# Patient Record
Sex: Female | Born: 1960 | Race: Black or African American | Hispanic: No | Marital: Single | Smoking: Never smoker
Health system: Southern US, Community
[De-identification: ages and names within clinical notes are randomized; demographics above are authoritative.]

## PROBLEM LIST (undated history)

## (undated) DIAGNOSIS — J449 Chronic obstructive pulmonary disease, unspecified: Secondary | ICD-10-CM

## (undated) DIAGNOSIS — I10 Essential (primary) hypertension: Secondary | ICD-10-CM

## (undated) DIAGNOSIS — J45909 Unspecified asthma, uncomplicated: Secondary | ICD-10-CM

## (undated) DIAGNOSIS — D860 Sarcoidosis of lung: Secondary | ICD-10-CM

---

## 2009-05-03 ENCOUNTER — Emergency Department (HOSPITAL_BASED_OUTPATIENT_CLINIC_OR_DEPARTMENT_OTHER): Admission: EM | Admit: 2009-05-03 | Discharge: 2009-05-03 | Payer: Self-pay | Admitting: Emergency Medicine

## 2009-05-03 ENCOUNTER — Ambulatory Visit: Payer: Self-pay | Admitting: Diagnostic Radiology

## 2010-10-30 LAB — POCT CARDIAC MARKERS
CKMB, poc: 1 ng/mL (ref 1.0–8.0)
Troponin i, poc: <0.05 ng/mL (ref 0.00–0.09)

## 2010-10-30 LAB — BASIC METABOLIC PANEL
BUN: 9 mg/dL (ref 6–23)
CO2: 28 mEq/L (ref 19–32)
Calcium: 9.1 mg/dL (ref 8.4–10.5)
Chloride: 106 mEq/L (ref 96–112)
GFR calc non Af Amer: 53 mL/min — ABNORMAL LOW (ref >60)
Potassium: 3.6 mEq/L (ref 3.5–5.1)

## 2010-10-30 LAB — DIFFERENTIAL
Eosinophils Absolute: 0.1 10*3/uL (ref 0.0–0.7)
Eosinophils Relative: 3 % (ref 0–5)
Lymphocytes Relative: 25 % (ref 12–46)
Monocytes Relative: 12 % (ref 3–12)
Neutro Abs: 3.2 10*3/uL (ref 1.7–7.7)
Neutrophils Relative %: 60 % (ref 43–77)

## 2010-10-30 LAB — CBC
HCT: 42.6 % (ref 36.0–46.0)
MCHC: 34 g/dL (ref 30.0–36.0)
Platelets: 280 10*3/uL (ref 150–400)
RBC: 4.69 MIL/uL (ref 3.87–5.11)
WBC: 5.2 10*3/uL (ref 4.0–10.5)

## 2010-10-30 LAB — POCT B-TYPE NATRIURETIC PEPTIDE (BNP): B Natriuretic Peptide, POC: 9.4 pg/mL (ref 0–100)

## 2012-03-06 ENCOUNTER — Encounter (HOSPITAL_BASED_OUTPATIENT_CLINIC_OR_DEPARTMENT_OTHER): Payer: Self-pay | Admitting: *Deleted

## 2012-03-06 ENCOUNTER — Emergency Department (HOSPITAL_BASED_OUTPATIENT_CLINIC_OR_DEPARTMENT_OTHER)
Admission: EM | Admit: 2012-03-06 | Discharge: 2012-03-07 | Disposition: A | Discharge status: Home / Self Care | Payer: 59 | Attending: Emergency Medicine | Admitting: Emergency Medicine

## 2012-03-06 ENCOUNTER — Emergency Department (HOSPITAL_BASED_OUTPATIENT_CLINIC_OR_DEPARTMENT_OTHER): Payer: 59

## 2012-03-06 DIAGNOSIS — J4489 Other specified chronic obstructive pulmonary disease: Secondary | ICD-10-CM | POA: Insufficient documentation

## 2012-03-06 DIAGNOSIS — Z79899 Other long term (current) drug therapy: Secondary | ICD-10-CM | POA: Insufficient documentation

## 2012-03-06 DIAGNOSIS — E119 Type 2 diabetes mellitus without complications: Secondary | ICD-10-CM | POA: Insufficient documentation

## 2012-03-06 DIAGNOSIS — J449 Chronic obstructive pulmonary disease, unspecified: Secondary | ICD-10-CM | POA: Insufficient documentation

## 2012-03-06 DIAGNOSIS — I10 Essential (primary) hypertension: Secondary | ICD-10-CM | POA: Insufficient documentation

## 2012-03-06 DIAGNOSIS — D869 Sarcoidosis, unspecified: Secondary | ICD-10-CM | POA: Insufficient documentation

## 2012-03-06 DIAGNOSIS — J45909 Unspecified asthma, uncomplicated: Secondary | ICD-10-CM

## 2012-03-06 DIAGNOSIS — J189 Pneumonia, unspecified organism: Secondary | ICD-10-CM | POA: Insufficient documentation

## 2012-03-06 HISTORY — DX: Chronic obstructive pulmonary disease, unspecified: J44.9

## 2012-03-06 HISTORY — DX: Unspecified asthma, uncomplicated: J45.909

## 2012-03-06 HISTORY — DX: Sarcoidosis of lung: D86.0

## 2012-03-06 HISTORY — DX: Essential (primary) hypertension: I10

## 2012-03-06 LAB — CBC WITH DIFFERENTIAL/PLATELET
Lymphocytes Relative: 16 % (ref 12–46)
MCV: 87.9 fL (ref 78.0–100.0)
Monocytes Absolute: 0.8 10*3/uL (ref 0.1–1.0)
Neutro Abs: 3.5 10*3/uL (ref 1.7–7.7)
Neutrophils Relative %: 67 % (ref 43–77)
WBC: 5.2 10*3/uL (ref 4.0–10.5)

## 2012-03-06 LAB — POCT I-STAT 3, ART BLOOD GAS (G3+)
Acid-Base Excess: 1 mmol/L (ref 0.0–2.0)
Patient temperature: 98.1
pCO2 arterial: 41.4 mmHg (ref 35.0–45.0)
pH, Arterial: 7.407 (ref 7.350–7.450)
pO2, Arterial: 64 mmHg — ABNORMAL LOW (ref 80.0–100.0)

## 2012-03-06 LAB — COMPREHENSIVE METABOLIC PANEL
ALT: 25 U/L (ref 0–35)
AST: 32 U/L (ref 0–37)
Alkaline Phosphatase: 81 U/L (ref 39–117)
Calcium: 9.6 mg/dL (ref 8.4–10.5)
Chloride: 102 mEq/L (ref 96–112)
GFR calc non Af Amer: 57 mL/min — ABNORMAL LOW (ref >90)
Potassium: 3.6 mEq/L (ref 3.5–5.1)
Total Bilirubin: 0.3 mg/dL (ref 0.3–1.2)
Total Protein: 7.1 g/dL (ref 6.0–8.3)

## 2012-03-06 MED ORDER — DEXTROSE 5 % IV SOLN
500.0000 mg | INTRAVENOUS | Status: DC
  Administered 2012-03-07: 500 mg via INTRAVENOUS
  Filled 2012-03-06: qty 500

## 2012-03-06 MED ORDER — ALBUTEROL SULFATE (5 MG/ML) 0.5% IN NEBU
INHALATION_SOLUTION | RESPIRATORY_TRACT | Status: AC
  Filled 2012-03-06: qty 0.5

## 2012-03-06 MED ORDER — ALBUTEROL SULFATE (5 MG/ML) 0.5% IN NEBU
5.0000 mg | INHALATION_SOLUTION | Freq: Once | RESPIRATORY_TRACT | Status: AC
  Administered 2012-03-06: 5 mg via RESPIRATORY_TRACT

## 2012-03-06 MED ORDER — IPRATROPIUM BROMIDE 0.02 % IN SOLN
RESPIRATORY_TRACT | Status: AC
  Administered 2012-03-06: 0.5 mg via RESPIRATORY_TRACT
  Filled 2012-03-06: qty 2.5

## 2012-03-06 MED ORDER — ALBUTEROL SULFATE (5 MG/ML) 0.5% IN NEBU
INHALATION_SOLUTION | RESPIRATORY_TRACT | Status: AC
  Administered 2012-03-06: 5 mg via RESPIRATORY_TRACT
  Filled 2012-03-06: qty 1

## 2012-03-06 MED ORDER — METHYLPREDNISOLONE SODIUM SUCC 125 MG IJ SOLR
125.0000 mg | Freq: Once | INTRAMUSCULAR | Status: AC
  Administered 2012-03-06: 125 mg via INTRAVENOUS
  Filled 2012-03-06: qty 2

## 2012-03-06 MED ORDER — SODIUM CHLORIDE 0.9 % IV SOLN
Freq: Once | INTRAVENOUS | Status: AC
  Administered 2012-03-06: 23:00:00 via INTRAVENOUS

## 2012-03-06 MED ORDER — IPRATROPIUM BROMIDE 0.02 % IN SOLN
0.5000 mg | Freq: Once | RESPIRATORY_TRACT | Status: AC
  Administered 2012-03-06: 0.5 mg via RESPIRATORY_TRACT

## 2012-03-06 MED ORDER — ALBUTEROL (5 MG/ML) CONTINUOUS INHALATION SOLN
10.0000 mg/h | INHALATION_SOLUTION | RESPIRATORY_TRACT | Status: DC
  Administered 2012-03-06: 10 mg/h via RESPIRATORY_TRACT
  Filled 2012-03-06: qty 20

## 2012-03-06 MED ORDER — CEFTRIAXONE SODIUM 1 G IJ SOLR
1.0000 g | INTRAMUSCULAR | Status: DC
  Administered 2012-03-06: 1 g via INTRAVENOUS
  Filled 2012-03-06: qty 10

## 2012-03-06 NOTE — ED Notes (Signed)
Pt presents to ED today with [facility] and asthma.  Pt has hx of same.  Pt has albuterol at home and took one dose with no relief in sx.  Pt has wheexing bilat.  RT at bedside for treatment

## 2012-03-06 NOTE — ED Provider Notes (Signed)
History     CSN: [id number]  Arrival date & time 03/06/12  2048   First MD Initiated Contact with Patient 03/06/12 2126      Chief Complaint  Patient presents with  . Shortness of Breath    (Consider location/radiation/quality/duration/timing/severity/associated sxs/prior treatment) Patient is a 51 y.o. female presenting with shortness of breath. The history is provided by the patient. No language interpreter was used.  Shortness of Breath  The current episode started today. The onset was gradual. The problem occurs continuously. The problem has been rapidly worsening. The problem is severe. Nothing relieves the symptoms. Nothing aggravates the symptoms. Associated symptoms include shortness of breath and wheezing. There was no intake of a foreign body. She was not exposed to toxic fumes. She has not inhaled smoke recently. She is currently using steroids. She has had prior hospitalizations. Her past medical history is significant for asthma and past wheezing. Recently, medical care has been given by the PCP and by a specialist.  Pt has a history of sarcoid and asthma.  Pt reports she had a fever earlier in the week.  Pt complains of being short of breath and wheezing today.    Past Medical History  Diagnosis Date  . Asthma   . Diabetes mellitus   . Hypertension   . Sarcoidosis of lung   . COPD (chronic obstructive pulmonary disease)     History reviewed. No pertinent past surgical history.  No family history on file.  History  Substance Use Topics  . Smoking status: Never Smoker   . Smokeless tobacco: Not on file  . Alcohol Use: No    OB History    Grav Para Term Preterm Abortions TAB SAB Ect Mult Living                  Review of Systems  Respiratory: Positive for shortness of breath and wheezing.   All other systems reviewed and are negative.    Allergies  Review of patient's allergies indicates no known allergies.  Home Medications   Current Outpatient Rx    Name Route Sig Dispense Refill  . ALBUTEROL SULFATE HFA 108 (90 BASE) MCG/ACT IN AERS Inhalation Inhale 2 puffs into the lungs every 6 (six) hours as needed. For shortness of breath or wheezing    . ALBUTEROL SULFATE (2.5 MG/3ML) 0.083% IN NEBU Nebulization Take 2.5 mg by nebulization every 6 (six) hours as needed. For shortness of breath or wheezing    . FLUTICASONE-SALMETEROL 250-50 MCG/DOSE IN AEPB Inhalation Inhale 1 puff into the lungs every 12 (twelve) hours.    Marland Kitchen FOLIC ACID 1 MG PO TABS Oral Take 1 mg by mouth daily.    Marland Kitchen GLIPIZIDE PO Oral Take 1 tablet by mouth daily.    Marland Kitchen LISINOPRIL PO Oral Take 1 tablet by mouth daily.    Marland Kitchen METFORMIN HCL 500 MG PO TABS Oral Take 1,000 mg by mouth daily.    Marland Kitchen METHOTREXATE SODIUM 2.5 MG PO TABS Oral Take 7.5 mg by mouth once a week. On Tuesdays    . OLANZAPINE PO Oral Take 1 tablet by mouth daily.    Marland Kitchen PREDNISONE 10 MG PO TABS Oral Take 10 mg by mouth daily.      BP 174/110  Pulse 94  Temp 98.7 F (37.1 C) (Oral)  Resp 24  SpO2 97%  Physical Exam  Nursing note and vitals reviewed. Constitutional: She is oriented to person, place, and time. She appears well-developed and well-nourished.  HENT:  Head: Normocephalic and atraumatic.  Right Ear: External ear normal.  Left Ear: External ear normal.  Nose: Nose normal.  Mouth/Throat: Oropharynx is clear and moist.  Eyes: Conjunctivae and EOM are normal. Pupils are equal, round, and reactive to light.  Neck: Normal range of motion. Neck supple.  Cardiovascular: Regular rhythm.        tachy  Pulmonary/Chest: She has wheezes.  Abdominal: Soft.  Musculoskeletal: Normal range of motion.  Neurological: She is alert and oriented to person, place, and time. She has normal reflexes.  Skin: Skin is warm.  Psychiatric: She has a normal mood and affect.    ED Course  Procedures (including critical care time)  Labs Reviewed - No data to display Dg Chest 2 View  03/06/2012  *RADIOLOGY REPORT*   Clinical Data: Asthma.  Wheezing.  Short of breath.  Difficulty breathing.  Sarcoidosis.  CHEST - 2 VIEW  Comparison: 05/03/2009.  Findings: Bulky mediastinal adenopathy.  Scattered areas of subsegmental atelectasis.  Patchy airspace disease is present in the lateral right upper lobe on the frontal view.  There is also right lower lobe airspace disease present.  Findings are compatible with multifocal pneumonia.  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  The cardiopericardial silhouette is unchanged compared to prior. On the lateral view, air bronchograms are noted in the lower lobes over the lower thoracic spine.  IMPRESSION:  1.  Changes of sarcoidosis with bulky mediastinal and hilar adenopathy. 2.  New right lower lobe and lateral right upper lobe new airspace opacity favored to represent pneumonia over alveolar sarcoidosis.  Original Report Authenticated By: Andreas Newport, M.D.     1. Community acquired pneumonia   2. Asthma   3. Sarcoid       MDM  Pt had albuterol and atrovent with no relief.  Pt started on 1 hour continous neb.  Pt reports some relief,  Chest xray shows pneumonia,  Pt given zithromax and rocephin. Pt given solumedrol.   Pt has continued wheezing.   I spoke to the [facility] hospitalist who will admit. To monitored bed.           Lonia Skinner [place], [place] 03/07/12 0001

## 2012-03-06 NOTE — ED Notes (Signed)
Patient SOB, audible wheezing. Albuterol treatment PTA

## 2012-03-07 NOTE — ED Provider Notes (Signed)
Medical screening examination/treatment/procedure(s) were performed by non-physician practitioner and as supervising physician I was immediately available for consultation/collaboration.   Mehak Roskelley, MD 03/07/12 0113 

## 2012-03-07 NOTE — ED Notes (Signed)
Pt admitted with IV catheter intact.  Charted as removed as pt sent to non-EPIC facility

## 2012-03-07 NOTE — ED Notes (Signed)
Pt is on the phone updating family members on admission

## 2012-05-30 ENCOUNTER — Institutional Professional Consult (permissible substitution): Payer: 59 | Admitting: Emergency Medicine

## 2013-10-24 ENCOUNTER — Encounter (HOSPITAL_BASED_OUTPATIENT_CLINIC_OR_DEPARTMENT_OTHER): Payer: Self-pay | Admitting: Emergency Medicine

## 2013-10-24 ENCOUNTER — Emergency Department (HOSPITAL_BASED_OUTPATIENT_CLINIC_OR_DEPARTMENT_OTHER)
Admission: EM | Admit: 2013-10-24 | Discharge: 2013-10-25 | Disposition: A | Discharge status: Other Acute Inpatient Hospital | Payer: Medicare Other | Attending: Emergency Medicine | Admitting: Emergency Medicine

## 2013-10-24 ENCOUNTER — Emergency Department (HOSPITAL_BASED_OUTPATIENT_CLINIC_OR_DEPARTMENT_OTHER): Payer: Medicare Other

## 2013-10-24 DIAGNOSIS — R509 Fever, unspecified: Secondary | ICD-10-CM | POA: Insufficient documentation

## 2013-10-24 DIAGNOSIS — Z7982 Long term (current) use of aspirin: Secondary | ICD-10-CM | POA: Insufficient documentation

## 2013-10-24 DIAGNOSIS — J45901 Unspecified asthma with (acute) exacerbation: Principal | ICD-10-CM

## 2013-10-24 DIAGNOSIS — J40 Bronchitis, not specified as acute or chronic: Secondary | ICD-10-CM

## 2013-10-24 DIAGNOSIS — I10 Essential (primary) hypertension: Secondary | ICD-10-CM | POA: Insufficient documentation

## 2013-10-24 DIAGNOSIS — Z8619 Personal history of other infectious and parasitic diseases: Secondary | ICD-10-CM | POA: Insufficient documentation

## 2013-10-24 DIAGNOSIS — IMO0002 Reserved for concepts with insufficient information to code with codable children: Secondary | ICD-10-CM | POA: Insufficient documentation

## 2013-10-24 DIAGNOSIS — E119 Type 2 diabetes mellitus without complications: Secondary | ICD-10-CM | POA: Insufficient documentation

## 2013-10-24 DIAGNOSIS — J441 Chronic obstructive pulmonary disease with (acute) exacerbation: Secondary | ICD-10-CM | POA: Insufficient documentation

## 2013-10-24 DIAGNOSIS — Z79899 Other long term (current) drug therapy: Secondary | ICD-10-CM | POA: Insufficient documentation

## 2013-10-24 LAB — CBC WITH DIFFERENTIAL/PLATELET
Basophils Absolute: 0 10*3/uL (ref 0.0–0.1)
Basophils Relative: 0 % (ref 0–1)
Eosinophils Absolute: 0.1 10*3/uL (ref 0.0–0.7)
Eosinophils Relative: 2 % (ref 0–5)
HCT: 46.4 % — ABNORMAL HIGH (ref 36.0–46.0)
Hemoglobin: 15.8 g/dL — ABNORMAL HIGH (ref 12.0–15.0)
LYMPHS ABS: 1.1 10*3/uL (ref 0.7–4.0)
LYMPHS PCT: 13 % (ref 12–46)
MCH: 29.9 pg (ref 26.0–34.0)
MCHC: 34.1 g/dL (ref 30.0–36.0)
MCV: 87.9 fL (ref 78.0–100.0)
Monocytes Absolute: 1 10*3/uL (ref 0.1–1.0)
Monocytes Relative: 12 % (ref 3–12)
NEUTROS PCT: 73 % (ref 43–77)
Neutro Abs: 6.1 10*3/uL (ref 1.7–7.7)
PLATELETS: 277 10*3/uL (ref 150–400)
RBC: 5.28 MIL/uL — AB (ref 3.87–5.11)
RDW: 15 % (ref 11.5–15.5)
WBC: 8.4 10*3/uL (ref 4.0–10.5)

## 2013-10-24 LAB — PRO B NATRIURETIC PEPTIDE: Pro B Natriuretic peptide (BNP): 202.2 pg/mL — ABNORMAL HIGH (ref 0–125)

## 2013-10-24 LAB — COMPREHENSIVE METABOLIC PANEL
ALT: 23 U/L (ref 0–35)
AST: 50 U/L — ABNORMAL HIGH (ref 0–37)
Albumin: 4 g/dL (ref 3.5–5.2)
Alkaline Phosphatase: 131 U/L — ABNORMAL HIGH (ref 39–117)
BUN: 8 mg/dL (ref 6–23)
CO2: 23 mEq/L (ref 19–32)
Calcium: 9.7 mg/dL (ref 8.4–10.5)
Chloride: 100 mEq/L (ref 96–112)
Creatinine, Ser: 1 mg/dL (ref 0.50–1.10)
GFR calc non Af Amer: 63 mL/min — ABNORMAL LOW (ref >90)
GFR, EST AFRICAN AMERICAN: 73 mL/min — AB (ref >90)
GLUCOSE: 122 mg/dL — AB (ref 70–99)
POTASSIUM: 3.7 meq/L (ref 3.7–5.3)
SODIUM: 140 meq/L (ref 137–147)
Total Bilirubin: 0.6 mg/dL (ref 0.3–1.2)
Total Protein: 8 g/dL (ref 6.0–8.3)

## 2013-10-24 LAB — TROPONIN I: Troponin I: <0.3 ng/mL (ref <0.30)

## 2013-10-24 LAB — I-STAT CG4 LACTIC ACID, ED: Lactic Acid, Venous: 2.84 mmol/L — ABNORMAL HIGH (ref 0.5–2.2)

## 2013-10-24 MED ORDER — ACETAMINOPHEN 500 MG PO TABS
1000.0000 mg | ORAL_TABLET | Freq: Once | ORAL | Status: AC
  Administered 2013-10-24: 1000 mg via ORAL
  Filled 2013-10-24: qty 2

## 2013-10-24 MED ORDER — SODIUM CHLORIDE 0.9 % IV BOLUS (SEPSIS)
1000.0000 mL | Freq: Once | INTRAVENOUS | Status: AC
  Administered 2013-10-24: 1000 mL via INTRAVENOUS

## 2013-10-24 MED ORDER — METHYLPREDNISOLONE SODIUM SUCC 125 MG IJ SOLR
125.0000 mg | Freq: Once | INTRAMUSCULAR | Status: AC
  Administered 2013-10-24: 125 mg via INTRAVENOUS
  Filled 2013-10-24: qty 2

## 2013-10-24 MED ORDER — VANCOMYCIN HCL 10 G IV SOLR
2000.0000 mg | Freq: Once | INTRAVENOUS | Status: DC
  Filled 2013-10-24: qty 2000

## 2013-10-24 MED ORDER — VANCOMYCIN HCL 10 G IV SOLR
1250.0000 mg | Freq: Two times a day (BID) | INTRAVENOUS | Status: DC
  Filled 2013-10-24: qty 1250

## 2013-10-24 MED ORDER — VANCOMYCIN HCL IN DEXTROSE 1-5 GM/200ML-% IV SOLN
1000.0000 mg | Freq: Once | INTRAVENOUS | Status: AC
  Administered 2013-10-24: 1000 mg via INTRAVENOUS
  Filled 2013-10-24: qty 200

## 2013-10-24 MED ORDER — PIPERACILLIN-TAZOBACTAM 3.375 G IVPB 30 MIN
3.3750 g | Freq: Once | INTRAVENOUS | Status: AC
  Administered 2013-10-24: 3.375 g via INTRAVENOUS
  Filled 2013-10-24 (×2): qty 50

## 2013-10-24 MED ORDER — ALBUTEROL (5 MG/ML) CONTINUOUS INHALATION SOLN
10.0000 mg/h | INHALATION_SOLUTION | RESPIRATORY_TRACT | Status: AC
  Administered 2013-10-24: 10 mg/h via RESPIRATORY_TRACT
  Filled 2013-10-24: qty 20

## 2013-10-24 MED ORDER — IOHEXOL 350 MG/ML SOLN
100.0000 mL | Freq: Once | INTRAVENOUS | Status: AC | PRN
  Administered 2013-10-24: 100 mL via INTRAVENOUS

## 2013-10-24 MED ORDER — PIPERACILLIN-TAZOBACTAM 3.375 G IVPB: 3.3750 g | Freq: Three times a day (TID) | INTRAVENOUS | Status: DC

## 2013-10-24 NOTE — ED Notes (Signed)
Sob, chest pain and fever since yesterday. Hx of sarcoidosis.

## 2013-10-24 NOTE — Progress Notes (Addendum)
ANTIBIOTIC CONSULT NOTE - INITIAL  Pharmacy Consult for vancomycin and Zosyn Indication: CAP in the setting of chronic prednisone and methotrexate  Allergies  Allergen Reactions  . Cymbalta [Duloxetine Hcl]   . Trazodone And Nefazodone     Patient Measurements: Height: 5\' 5"  (165.1 cm) Weight: 225 lb (102.059 kg) IBW/kg (Calculated) : 57  Vital Signs: Temp: 103 F (39.4 C) (03/31 1915) Temp src: Oral (03/31 1915) BP: 150/80 mmHg (03/31 1915) Pulse Rate: 130 (03/31 1915)  Labs:  Recent Labs  10/24/13 1941  WBC 8.4  HGB 15.8*  PLT 277   Estimated Creatinine Clearance: 69.2 ml/min (by C-G formula based on Cr of 1.1). No results found for this basename: VANCOTROUGH, VANCOPEAK, VANCORANDOM, GENTTROUGH, GENTPEAK, GENTRANDOM, TOBRATROUGH, TOBRAPEAK, TOBRARND, AMIKACINPEAK, AMIKACINTROU, AMIKACIN,  in the last 72 hours   Microbiology: No results found for this or any previous visit (from the past 720 hour(s)).  Medical History: Past Medical History  Diagnosis Date  . Asthma   . Diabetes mellitus   . Hypertension   . Sarcoidosis of lung   . COPD (chronic obstructive pulmonary disease)     Medications:  Scheduled:   Infusions:  . albuterol 10 mg/hr (10/24/13 1934)  . piperacillin-tazobactam    . sodium chloride    . sodium chloride    . vancomycin     Assessment: 53 yo F presents to [facility] ED with SOB, CP, and fever since previous day.  [facility], she is currently on chronic prednisone and methotrexate.  They wish to proceed with broad spectrum antibiotics usually reserved for HCAP.  Pharmacy has been consulted to dose vancomycin and Zosyn for CAP in the setting of potential immunocompromise.  Initial labs reveal WBC wnl and SCr 1 with estimated CrCl ~76.  Patient is currenlty tachycardic and febrile with a temperature of 103.  [facility] ED only carries vancomycin IV 1g bags in Pyxis cabinets.  Goal of Therapy:  Vancomycin  trough level 15-20 mcg/ml Resolution of infection  Plan:  - give vancomycin IV 1g (d/t medication access at [facility]), then once that infusion is complete, administer another 1g (for total of 2g) prior to transferring to [facility] - follow above vancomycin IV loadind dose with a maintenance dose of 1250mg  q12h - [facility] - give Zosyn IV 3.375g x1 dose (30 min infusion) in [facility], followed with 3.375g q8h (4h extended infusion) - monitor kidney function, WBC, temperature curve, any cultures, and clinical progression  Shelba FlakeNathan E. Achilles [place], PharmD Clinical Pharmacist - Resident Pager: [phone] Pharmacy: [phone] 10/24/2013 8:18 PM

## 2013-10-24 NOTE — ED Provider Notes (Signed)
This chart was scribed for Ashley Maw Narciso Stoutenburg, DO by Dorothey Baseman, ED Scribe. This patient was seen in room MH12/MH12 and the patient's care was started at 7:40 PM.  CHIEF COMPLAINT: fever, shortness of breath, chest pain  HPI:  HPI Comments: Ashley Ferrell is a 53 y.o. female with a history of COPD, asthma, and sarcoidosis of the lungs (patient takes 2.5 mg methotrexate once a week and 10 mg prednisone daily) who presents to the Emergency Department complaining of fever (103 measured in the ED) onset earlier today. Patient also reports an associated dry cough with chest pain and shortness of breath onset yesterday. She denies emesis, diarrhea. She reports that she did not receive a flu vaccination this year and is unsure if she received a pneumonia vaccination. She denies exposure to sick contacts with similar symptoms, prolonged periods of immobilization, exogenous hormone use, long flight, recent travel, surgery or trauma, or recent hospitalizations. She denies history of DVT/PE or cardiac disease. Patient also has a history of DM and HTN.    ROS: See HPI Constitutional: fever  Eyes: no drainage  ENT: no runny nose   Cardiovascular: chest pain  Resp: cough, SOB  GI: no vomiting, no diarrhea GU: no dysuria Integumentary: no rash  Allergy: no hives  Musculoskeletal: no leg swelling  Neurological: no slurred speech ROS otherwise negative  PAST MEDICAL HISTORY/PAST SURGICAL HISTORY:  Past Medical History  Diagnosis Date  . Asthma   . Diabetes mellitus   . Hypertension   . Sarcoidosis of lung   . COPD (chronic obstructive pulmonary disease)     MEDICATIONS:  Prior to Admission medications   Medication Sig Start Date End Date Taking? Authorizing Provider  aspirin 81 MG tablet Take 81 mg by mouth daily.   Yes Historical Provider, MD  diltiazem (CARDIZEM LA) 120 MG 24 hr tablet Take 120 mg by mouth daily.   Yes Historical Provider, MD  albuterol (PROVENTIL HFA;VENTOLIN HFA) 108 (90 BASE)  MCG/ACT inhaler Inhale 2 puffs into the lungs every 6 (six) hours as needed. For shortness of breath or wheezing    Historical Provider, MD  albuterol (PROVENTIL) (2.5 MG/3ML) 0.083% nebulizer solution Take 2.5 mg by nebulization every 6 (six) hours as needed. For shortness of breath or wheezing    Historical Provider, MD  buPROPion (WELLBUTRIN SR) 150 MG 12 hr tablet Take 150 mg by mouth 2 (two) times daily.    Historical Provider, MD  Fluticasone-Salmeterol (ADVAIR) 250-50 MCG/DOSE AEPB Inhale 1 puff into the lungs every 12 (twelve) hours.    Historical Provider, MD  folic acid (FOLVITE) 1 MG tablet Take 1 mg by mouth daily.    Historical Provider, MD  GLIPIZIDE PO Take 1 tablet by mouth daily.    Historical Provider, MD  LISINOPRIL PO Take 1 tablet by mouth daily.    Historical Provider, MD  metFORMIN (GLUCOPHAGE) 500 MG tablet Take 1,000 mg by mouth daily.    Historical Provider, MD  methotrexate 2.5 MG tablet Take 7.5 mg by mouth once a week. On Tuesdays    Historical Provider, MD  OLANZAPINE PO Take 1 tablet by mouth daily.    Historical Provider, MD  predniSONE (DELTASONE) 10 MG tablet Take 10 mg by mouth daily.    Historical Provider, MD    ALLERGIES:  Allergies  Allergen Reactions  . Cymbalta [Duloxetine Hcl]   . Trazodone And Nefazodone     SOCIAL HISTORY:  History  Substance Use Topics  . Smoking status: Never Smoker   .  Smokeless tobacco: Not on file  . Alcohol Use: No    FAMILY HISTORY: No family history on file.  EXAM: Triage Vitals: BP 150/80  Pulse 130  Temp(Src) 103 F (39.4 C) (Oral)  Resp 26  Ht 5\' 5"  (1.651 m)  Wt 225 lb (102.059 kg)  BMI 37.44 kg/m2  SpO2 92%  CONSTITUTIONAL: Alert and oriented and responds appropriately to questions. Well-appearing; well-nourished HEAD: Normocephalic EYES: Conjunctivae clear, PERRL ENT: normal nose; no rhinorrhea; moist mucous membranes; pharynx without lesions noted NECK: Supple, no meningismus, no LAD  CARD: S1  and S2 appreciated; no murmurs, no clicks, no rubs, no gallops; regular and tachycardic  RESP: Normal chest excursion without splinting, no rales; tachypneic with increased work of breathing; rhonchi and expiratory wheezing bilaterally ABD/GI: Normal bowel sounds; non-distended; soft, non-tender, no rebound, no guarding BACK:  The back appears normal and is non-tender to palpation, there is no CVA tenderness EXT: Normal ROM in all joints; non-tender to palpation; no edema; normal capillary refill; no cyanosis    SKIN: Normal color for age and race; warm NEURO: Moves all extremities equally PSYCH: The patient's mood and manner are appropriate. Grooming and personal hygiene are appropriate.  MEDICAL DECISION MAKING:  Patient with a history of asthma, COPD, and lung sarcoidosis presents to the ED with fever, shortness of breath, and chest pain since yesterday. Patient's fever was 103 and pulse oximetry was 92% upon arrival to the ED. Ordered an EKG and a chest x-ray. Ordered Tylenol and an albuterol continuous nebulizer to manage symptoms. Discussed that patient will likely need to be admitted to the hospital for further evaluation and care. Patient and family agreeable to plan.   ED PROGRESS:  DIAGNOSTIC STUDIES: Oxygen Saturation is 92% on room air, low by my interpretation.  She does not wear oxygen at home.  COORDINATION OF CARE: 7:43 PM- Ordered EKG, chest x-ray, UA. Ordered Tylenol and albuterol continuous nebulizer. Discussed treatment plan with patient at bedside and patient verbalized agreement.    9:22 PM  Pt's lungs sound much better after breathing treatment but she is still mildly hypoxic and tachycardic. His x-ray shows no obvious infiltrate. Her BNP is normal. Troponin negative. No leukocytosis. Her lactate is slightly elevated. Given her hypoxia and tachycardia, will obtain a CT of her chest to rule out pulmonary embolus. She is receiving broad-spectrum antibiotics for possible  pneumonia given she is immunocompromised. Have discussed with patient she will need admission. She would like to be admitted to [facility] regional. Her PCP is with [facility], Dr. Mena Pauls.   10:57 PM  Pt's CT scan shows no acute embolus but there is bronchitis. No obvious pneumonia. She still has an oxygen requirement.  Suspect COPD exacerbation on top of bronchitis. She is receiving broad-spectrum antibiotics. We'll admit to [facility].  11:14 PM  Spoke with Darlina Guys with hospitalist service in [facility] regional for admission to telemetry, and patient.  Results for orders placed during the hospital encounter of 10/24/13  CBC WITH DIFFERENTIAL      Result Value Ref Range   WBC 8.4  4.0 - 10.5 K/uL   RBC 5.28 (*) 3.87 - 5.11 MIL/uL   Hemoglobin 15.8 (*) 12.0 - 15.0 g/dL   HCT 72.5 (*) 36.6 - 44.0 %   MCV 87.9  78.0 - 100.0 fL   MCH 29.9  26.0 - 34.0 pg   MCHC 34.1  30.0 - 36.0 g/dL   RDW 34.7  42.5 - 95.6 %  Platelets 277  150 - 400 K/uL   Neutrophils Relative % 73  43 - 77 %   Neutro Abs 6.1  1.7 - 7.7 K/uL   Lymphocytes Relative 13  12 - 46 %   Lymphs Abs 1.1  0.7 - 4.0 K/uL   Monocytes Relative 12  3 - 12 %   Monocytes Absolute 1.0  0.1 - 1.0 K/uL   Eosinophils Relative 2  0 - 5 %   Eosinophils Absolute 0.1  0.0 - 0.7 K/uL   Basophils Relative 0  0 - 1 %   Basophils Absolute 0.0  0.0 - 0.1 K/uL  COMPREHENSIVE METABOLIC PANEL      Result Value Ref Range   Sodium 140  137 - 147 mEq/L   Potassium 3.7  3.7 - 5.3 mEq/L   Chloride 100  96 - 112 mEq/L   CO2 23  19 - 32 mEq/L   Glucose, Bld 122 (*) 70 - 99 mg/dL   BUN 8  6 - 23 mg/dL   Creatinine, Ser 6.961.00  0.50 - 1.10 mg/dL   Calcium 9.7  8.4 - 29.510.5 mg/dL   Total Protein 8.0  6.0 - 8.3 g/dL   Albumin 4.0  3.5 - 5.2 g/dL   AST 50 (*) 0 - 37 U/L   ALT 23  0 - 35 U/L   Alkaline Phosphatase 131 (*) 39 - 117 U/L   Total Bilirubin 0.6  0.3 - 1.2 mg/dL   GFR calc non Af Amer 63 (*) >90 mL/min   GFR calc  Af Amer 73 (*) >90 mL/min  TROPONIN I      Result Value Ref Range   Troponin I <0.30  <0.30 ng/mL  PRO B NATRIURETIC PEPTIDE      Result Value Ref Range   Pro B Natriuretic peptide (BNP) 202.2 (*) 0 - 125 pg/mL  I-STAT CG4 LACTIC ACID, ED      Result Value Ref Range   Lactic Acid, Venous 2.84 (*) 0.5 - 2.2 mmol/L   Dg Chest Port 1 View  10/24/2013   CLINICAL DATA:  Dry cough, chest pain and shortness of breath  EXAM: PORTABLE CHEST - 1 VIEW  COMPARISON:  Most recent prior chest x-ray 03/06/2012; prior chest CT 01/03/2013  FINDINGS: Cardiac and mediastinal contours remain within normal limits. The bronchitic changes and diffuse mild interstitial prominence are similar to slightly improved compared to prior. No pneumothorax, pleural effusion, pulmonary edema or focal airspace consolidation. No acute osseous abnormality.  IMPRESSION: No active disease.  Background bronchitic changes and interstitial prominence suggests underlying chronic lung disease.   Electronically Signed   By: Malachy MoanHeath  McCullough M.D.   On: 10/24/2013 20:10     EKG Interpretation  Date/Time:  Tuesday October 24 2013 19:20:42 EDT Ventricular Rate:  127 PR Interval:  128 QRS Duration: 76 QT Interval:  318 QTC Calculation: 462 R Axis:   -60 Text Interpretation:  Sinus tachycardia with Premature atrial complexes Left axis deviation Anterior infarct , age undetermined Abnormal ECG Confirmed by Evalyn Shultis,  DO, Calla Wedekind [phone]([id number]) on 10/24/2013 7:39:34 PM      I personally performed the services described in this documentation, which was scribed in my presence. The recorded information has been reviewed and is accurate.    Ashley MawKristen N Shatiqua Heroux, DO 10/24/13 2314

## 2013-10-30 LAB — CULTURE, BLOOD (ROUTINE X 2)
Culture: NO GROWTH
Culture: NO GROWTH

## 2014-03-02 ENCOUNTER — Encounter (HOSPITAL_BASED_OUTPATIENT_CLINIC_OR_DEPARTMENT_OTHER): Payer: Self-pay | Admitting: Emergency Medicine

## 2014-03-02 ENCOUNTER — Emergency Department (HOSPITAL_BASED_OUTPATIENT_CLINIC_OR_DEPARTMENT_OTHER)
Admission: EM | Admit: 2014-03-02 | Discharge: 2014-03-03 | Disposition: A | Discharge status: Home / Self Care | Payer: Medicare Other | Attending: Emergency Medicine | Admitting: Emergency Medicine

## 2014-03-02 DIAGNOSIS — Z79899 Other long term (current) drug therapy: Secondary | ICD-10-CM | POA: Diagnosis not present

## 2014-03-02 DIAGNOSIS — Z8709 Personal history of other diseases of the respiratory system: Secondary | ICD-10-CM | POA: Diagnosis not present

## 2014-03-02 DIAGNOSIS — J4489 Other specified chronic obstructive pulmonary disease: Secondary | ICD-10-CM | POA: Insufficient documentation

## 2014-03-02 DIAGNOSIS — E119 Type 2 diabetes mellitus without complications: Secondary | ICD-10-CM | POA: Insufficient documentation

## 2014-03-02 DIAGNOSIS — Z7982 Long term (current) use of aspirin: Secondary | ICD-10-CM | POA: Diagnosis not present

## 2014-03-02 DIAGNOSIS — J449 Chronic obstructive pulmonary disease, unspecified: Secondary | ICD-10-CM | POA: Insufficient documentation

## 2014-03-02 DIAGNOSIS — M25549 Pain in joints of unspecified hand: Secondary | ICD-10-CM | POA: Diagnosis present

## 2014-03-02 DIAGNOSIS — IMO0002 Reserved for concepts with insufficient information to code with codable children: Secondary | ICD-10-CM | POA: Insufficient documentation

## 2014-03-02 DIAGNOSIS — G5610 Other lesions of median nerve, unspecified upper limb: Secondary | ICD-10-CM | POA: Insufficient documentation

## 2014-03-02 DIAGNOSIS — I10 Essential (primary) hypertension: Secondary | ICD-10-CM | POA: Insufficient documentation

## 2014-03-02 DIAGNOSIS — G5611 Other lesions of median nerve, right upper limb: Secondary | ICD-10-CM

## 2014-03-02 MED ORDER — HYDROCODONE-ACETAMINOPHEN 5-325 MG PO TABS: 1.0000 | ORAL_TABLET | Freq: Four times a day (QID) | ORAL | Status: AC | PRN

## 2014-03-02 NOTE — ED Notes (Addendum)
C/o pain to right index and middle finger x 2 weeks-denies injury-pt states she has been seen by PCP and Neuro for same c/o

## 2014-03-02 NOTE — ED Provider Notes (Signed)
CSN: [id number]     Arrival date & time 03/02/14  2110 History  This chart was scribed for Ashley Seamen, MD by Luisa Dago, ED Scribe. This patient was seen in room MH02/MH02 and the patient's care was started at 11:20 PM.     Chief Complaint  Patient presents with  . Hand Pain   The history is provided by the patient. No language interpreter was used.   HPI Comments: Ashley Ferrell is a 53 y.o. female with a history of Sarcoidosis, presents to the Emergency Department complaining of worsening right index and middle finger numbness and pain that started 2 weeks ago. She denies injury. She states that the numbness is localized to the volar aspect of those fingers. She states the pain radiates up her right arm.  Pt states that she was prescribed gabapentin by her Neurologist who later changed her prescription to Lyrica, neither of which have helped. The pain is moderate to severe, worse at night. She denies any other pertinent medical history. Denies any fever, chills, nausea, or emesis.      Past Medical History  Diagnosis Date  . Asthma   . Diabetes mellitus   . Hypertension   . Sarcoidosis of lung   . COPD (chronic obstructive pulmonary disease)    History reviewed. No pertinent past surgical history. No family history on file. History  Substance Use Topics  . Smoking status: Never Smoker   . Smokeless tobacco: Not on file  . Alcohol Use: No   OB History   Grav Para Term Preterm Abortions TAB SAB Ect Mult Living                 Review of Systems A complete 10 system review of systems was obtained and all systems are negative except as noted in the HPI and PMH.     Allergies  Cymbalta and Trazodone and nefazodone  Home Medications   Prior to Admission medications   Medication Sig Start Date End Date Taking? Authorizing Provider  Insulin Aspart (NOVOLOG [place]) Inject into the skin.   Yes Historical Provider, MD  insulin glargine (LANTUS) 100 UNIT/ML injection Inject into  the skin at bedtime.   Yes Historical Provider, MD  Pregabalin (LYRICA PO) Take by mouth.   Yes Historical Provider, MD  albuterol (PROVENTIL HFA;VENTOLIN HFA) 108 (90 BASE) MCG/ACT inhaler Inhale 2 puffs into the lungs every 6 (six) hours as needed. For shortness of breath or wheezing    Historical Provider, MD  albuterol (PROVENTIL) (2.5 MG/3ML) 0.083% nebulizer solution Take 2.5 mg by nebulization every 6 (six) hours as needed. For shortness of breath or wheezing    Historical Provider, MD  aspirin 81 MG tablet Take 81 mg by mouth daily.    Historical Provider, MD  buPROPion (WELLBUTRIN SR) 150 MG 12 hr tablet Take 150 mg by mouth 2 (two) times daily.    Historical Provider, MD  diltiazem (CARDIZEM LA) 120 MG 24 hr tablet Take 120 mg by mouth daily.    Historical Provider, MD  Fluticasone-Salmeterol (ADVAIR) 250-50 MCG/DOSE AEPB Inhale 1 puff into the lungs every 12 (twelve) hours.    Historical Provider, MD  folic acid (FOLVITE) 1 MG tablet Take 1 mg by mouth daily.    Historical Provider, MD  GLIPIZIDE PO Take 1 tablet by mouth daily.    Historical Provider, MD  HYDROcodone-acetaminophen (NORCO/VICODIN) 5-325 MG per tablet Take 1-2 tablets by mouth every 6 (six) hours as needed. 03/02/14   Dontavius Keim L  Cortlyn Cannell, MD  LISINOPRIL PO Take 1 tablet by mouth daily.    Historical Provider, MD  metFORMIN (GLUCOPHAGE) 500 MG tablet Take 1,000 mg by mouth daily.    Historical Provider, MD  methotrexate 2.5 MG tablet Take 7.5 mg by mouth once a week. On Tuesdays    Historical Provider, MD  OLANZAPINE PO Take 1 tablet by mouth daily.    Historical Provider, MD  predniSONE (DELTASONE) 10 MG tablet Take 10 mg by mouth daily.    Historical Provider, MD   BP 151/96  Pulse 109  Temp(Src) 98.7 F (37.1 C) (Oral)  Resp 32  Ht 5\' 5"  (1.651 m)  Wt 210 lb (95.255 kg)  BMI 34.95 kg/m2  SpO2 100%  Physical Exam  Nursing note and vitals reviewed. General: Well-developed, well-nourished female in no acute distress;  appearance consistent with age of record HENT: normocephalic; atraumatic; nasal congestion Eyes: pupils equal, round and reactive to light; extraocular muscles intact Neck: supple Heart: regular rate and rhythm Lungs: clear to auscultation bilaterally; faint expiratory and inspiratory wheezes.  Abdomen: soft; nondistended; nontender; no masses or hepatosplenomegaly; bowel sounds present Extremities: No deformity; full range of motion; pulses normal. Normal appearance of right hand; negative Tinel's and negative Phalen's. Neurologic: Awake, alert and oriented; motor function intact in all extremities and symmetric; no facial droop; subjectively altered sensation of volar aspect of right 2nd and 3rd fingers Skin: Warm and dry Psychiatric: Normal mood and affect  ED Course  Procedures (including critical care time)  DIAGNOSTIC STUDIES: Oxygen Saturation is 100% on RA, normal by my interpretation.    COORDINATION OF CARE: 11:24 PM- Pt advised of plan for treatment and pt agrees.   MDM  Symptomatology and pattern are consistent with carpal tunnel syndrome however the patient's Tinel's and Phalen's tests are negative. This probably still represents a right median nerve neuropathy.    Final diagnoses:  Median nerve neuropathy, right   I personally performed the services described in this documentation, which was scribed in my presence. The recorded information has been reviewed and is accurate.    Ashley SeamenJohn L Tonya Carlile, MD 03/02/14 [phone]

## 2015-11-08 IMAGING — CT CT ANGIO CHEST
2 of 6 series · 19 of 36 positions shown · IV contrast (APPLIED)
Comparison: DG CHEST 1V PORT dated 10/24/2013;

CLINICAL DATA: Shortness of breath, cough.  History of sarcoidosis.

EXAM:
CT ANGIOGRAPHY CHEST WITH CONTRAST
TECHNIQUE: Multidetector CT imaging of the chest was performed using the
standard protocol during bolus administration of intravenous
contrast. Multiplanar CT image reconstructions and MIPs were
obtained to evaluate the vascular anatomy.
CONTRAST:  100mL OMNIPAQUE IOHEXOL 350 MG/ML SOLN

[Series 6: pe 1.0 b26f · axial · 0.64mm/px · z∈[-348,-71]mm · 18 of 309 slices shown]
[im 16/309  lung]
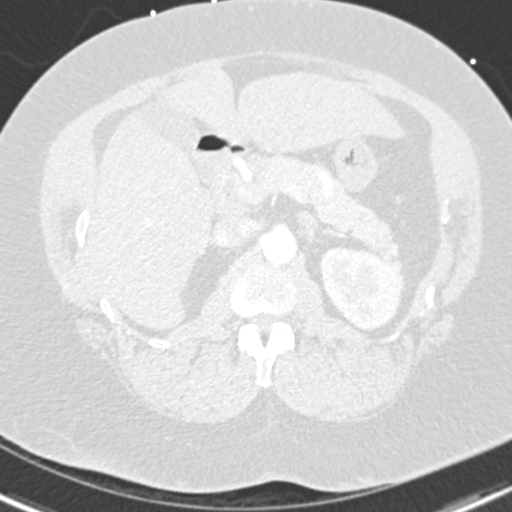
[im 31/309  mediastinal]
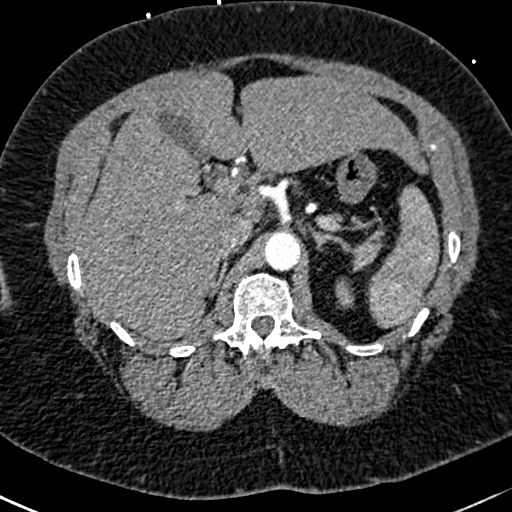
[im 47/309  lung]
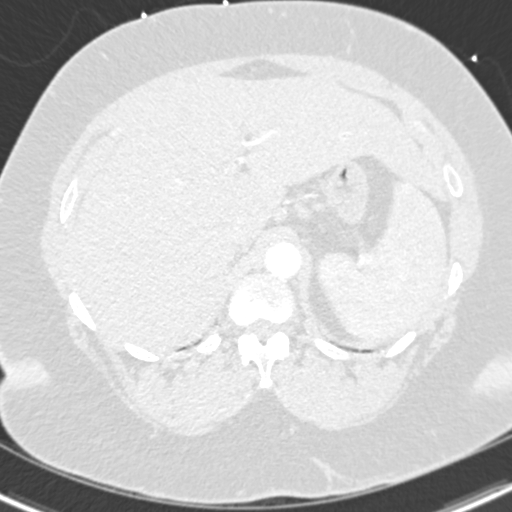
[im 62/309  mediastinal]
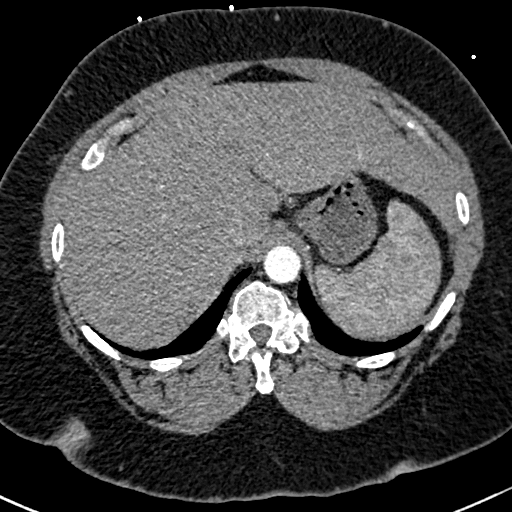
[im 78/309  lung]
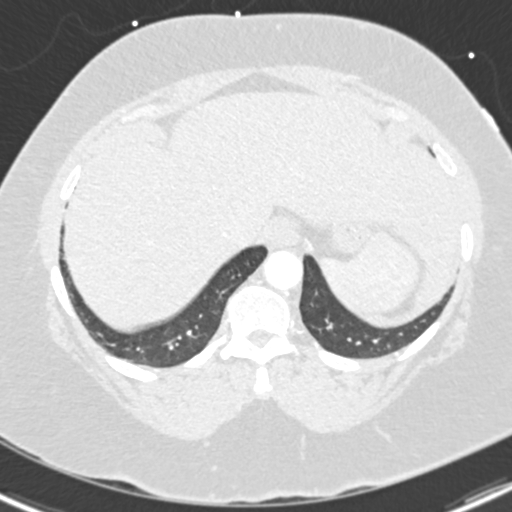
[im 93/309  mediastinal]
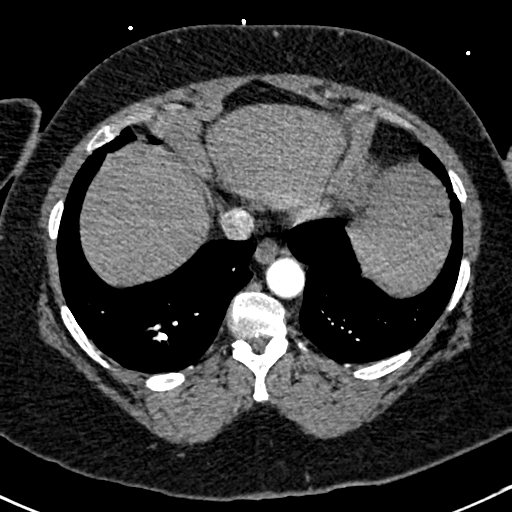
[im 108/309  lung]
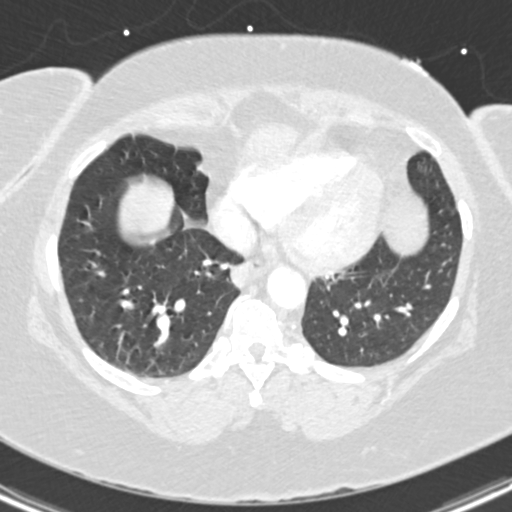
[im 124/309  mediastinal]
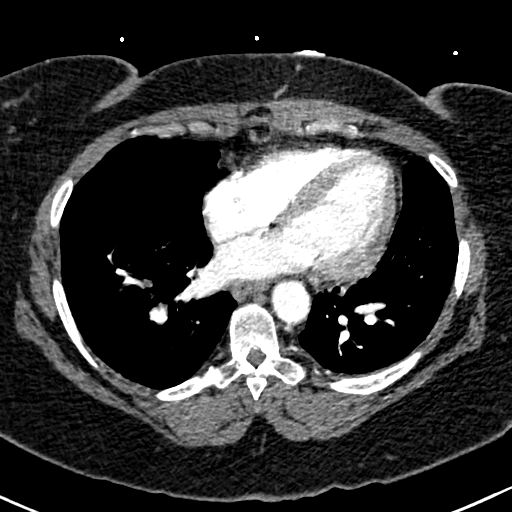
[im 139/309  lung]
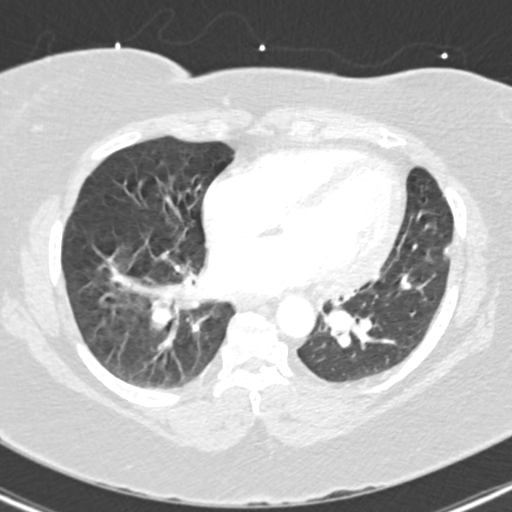
[im 170/309  mediastinal]
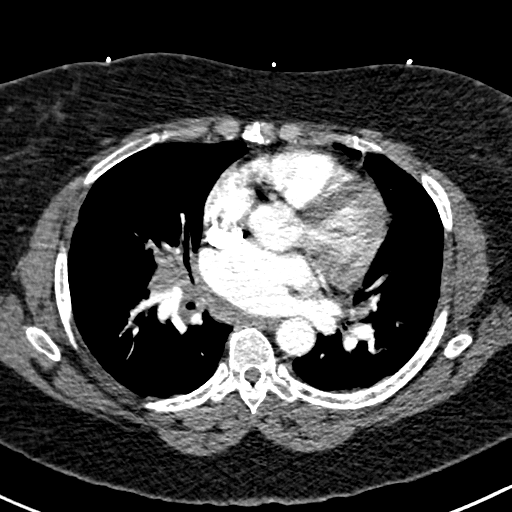
[im 185/309  lung]
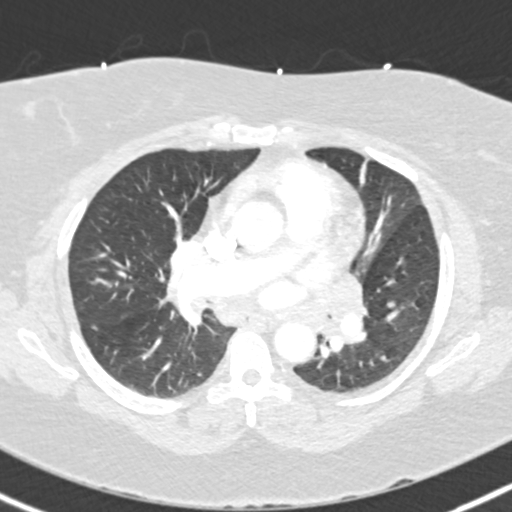
[im 201/309  mediastinal]
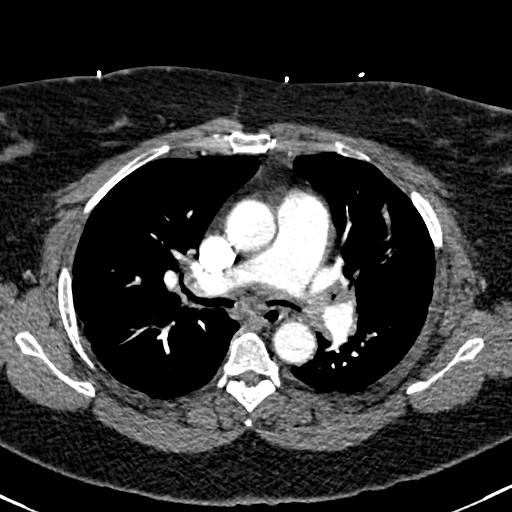
[im 216/309  lung]
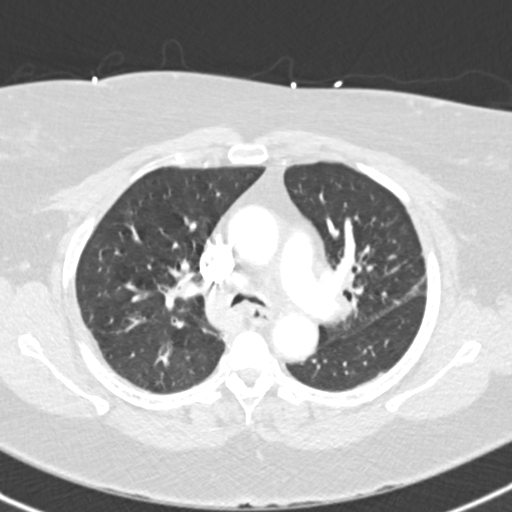
[im 232/309  mediastinal]
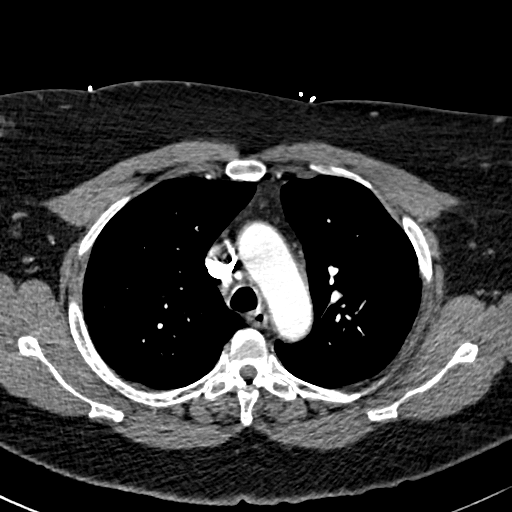
[im 247/309  lung]
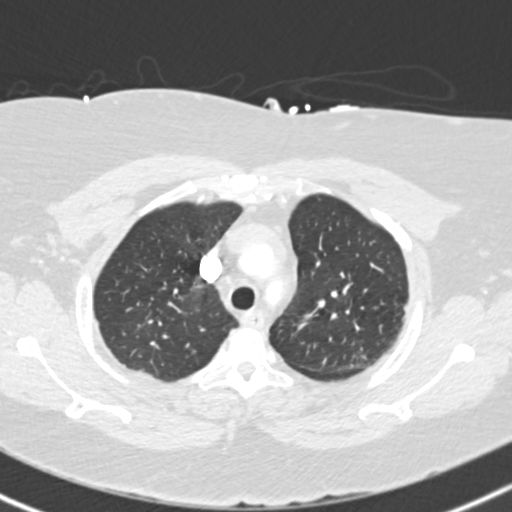
[im 262/309  mediastinal]
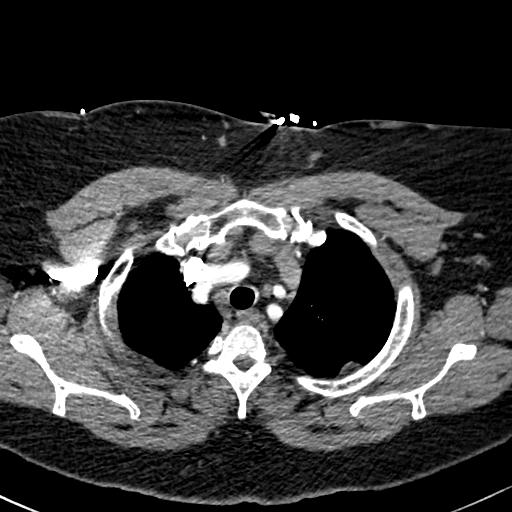
[im 278/309  lung]
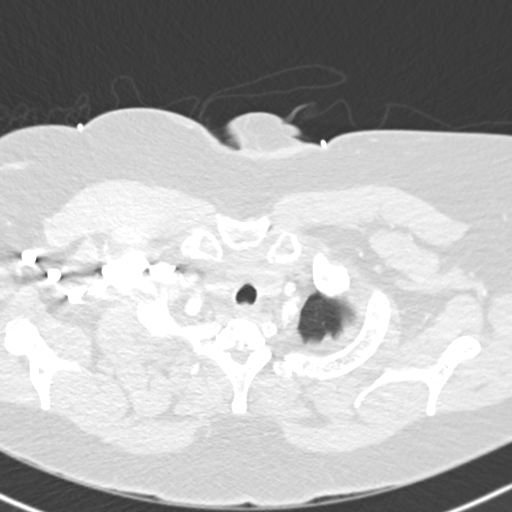
[im 293/309  mediastinal]
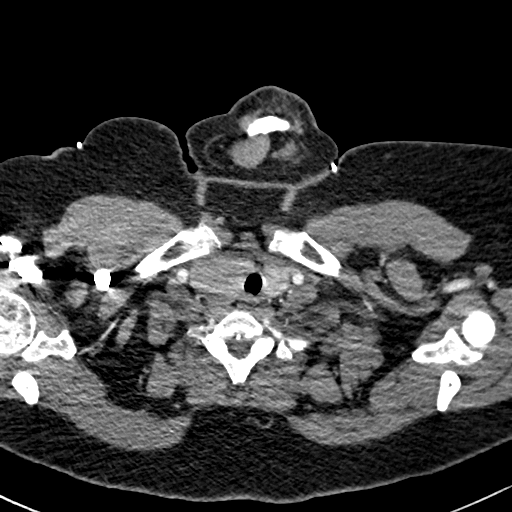

[Series 9: pe 2.0 coronal · coronal · 0.66mm/px · 1 of 116 slices shown]
[im 58/116  mediastinal]
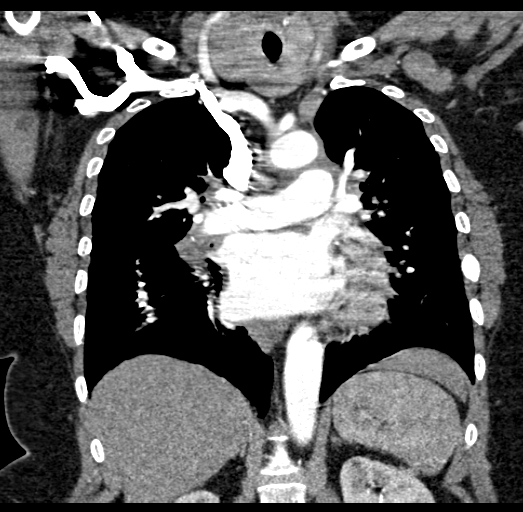

[19 of 36 positions shown; findings below may reference images not displayed]

NM CARDIAC W/ IMAGING
dated 03/14/2013 ; CT of the chest report dated October 21, 2012 though
images are not available for direct comparison.
FINDINGS: Adequate pulmonary artery arterial opacification. No pulmonary
arterial filling defects the level of the subsegmental branches
though mild respiratory motion may degrades sensitivity. Main
pulmonary artery is upper limits of normal in size.

Heart and pericardium are unremarkable. Two vessel aortic arch is a
normal variant, thoracic aorta is otherwise unremarkable.

Mild bronchial wall thickening . No pleural effusions or focal
consolidations. Mediastinal and hilar lymphadenopathy, for example
15 mm short axis pulmonary arterial window lymph node. Hilar
lymphadenopathy encases the bronchi. Thoracic esophagus is
unremarkable. No pneumothorax.

Included view of the abdomen is unremarkable. Patulous appearing
right internal jugular vein, normal variant. Osseous structures are
nonsuspicious.

Review of the MIP images confirms the above findings.
IMPRESSION: Mild respiratory motion degraded examination without acute pulmonary
embolus.

Mild cardiomegaly. Bronchial wall thickening may reflect bronchitis
without pneumonia.

Mediastinal and hilar lymphadenopathy on the which can be seen with
patient's sarcoidosis (and was described on prior report).

  By: Baek Shin Nalee
